# Patient Record
Sex: Male | Born: 1935 | Race: White | Hispanic: No | Marital: Married | Smoking: Never smoker
Health system: Southern US, Community
[De-identification: ages and names within clinical notes are randomized; demographics above are authoritative.]

## PROBLEM LIST (undated history)

## (undated) DIAGNOSIS — I252 Old myocardial infarction: Secondary | ICD-10-CM

## (undated) DIAGNOSIS — K219 Gastro-esophageal reflux disease without esophagitis: Secondary | ICD-10-CM

---

## 2019-02-22 ENCOUNTER — Other Ambulatory Visit: Payer: Self-pay

## 2019-02-22 ENCOUNTER — Encounter: Payer: Self-pay | Admitting: Emergency Medicine

## 2019-02-22 ENCOUNTER — Emergency Department
Admission: EM | Admit: 2019-02-22 | Discharge: 2019-02-22 | Disposition: A | Discharge status: Home / Self Care | Payer: Medicare PPO | Attending: Emergency Medicine | Admitting: Emergency Medicine

## 2019-02-22 ENCOUNTER — Emergency Department: Payer: Medicare PPO

## 2019-02-22 DIAGNOSIS — R42 Dizziness and giddiness: Secondary | ICD-10-CM

## 2019-02-22 DIAGNOSIS — I252 Old myocardial infarction: Secondary | ICD-10-CM | POA: Insufficient documentation

## 2019-02-22 DIAGNOSIS — R112 Nausea with vomiting, unspecified: Secondary | ICD-10-CM | POA: Insufficient documentation

## 2019-02-22 HISTORY — DX: Old myocardial infarction: I25.2

## 2019-02-22 HISTORY — DX: Gastro-esophageal reflux disease without esophagitis: K21.9

## 2019-02-22 LAB — CBC
HCT: 45.3 % (ref 39.0–52.0)
Hemoglobin: 15.7 g/dL (ref 13.0–17.0)
MCH: 29.6 pg (ref 26.0–34.0)
MCHC: 34.7 g/dL (ref 30.0–36.0)
MCV: 85.5 fL (ref 80.0–100.0)
Platelets: 247 10*3/uL (ref 150–400)
RBC: 5.3 MIL/uL (ref 4.22–5.81)
RDW: 14.2 % (ref 11.5–15.5)
WBC: 9.5 10*3/uL (ref 4.0–10.5)
nRBC: 0 % (ref 0.0–0.2)

## 2019-02-22 LAB — COMPREHENSIVE METABOLIC PANEL
ALT: 23 U/L (ref 0–44)
AST: 21 U/L (ref 15–41)
Albumin: 4.3 g/dL (ref 3.5–5.0)
Alkaline Phosphatase: 81 U/L (ref 38–126)
Anion gap: 14 (ref 5–15)
BUN: 16 mg/dL (ref 8–23)
CO2: 22 mmol/L (ref 22–32)
Calcium: 9.2 mg/dL (ref 8.9–10.3)
Chloride: 101 mmol/L (ref 98–111)
Creatinine, Ser: 1.02 mg/dL (ref 0.61–1.24)
GFR calc Af Amer: >60 mL/min (ref >60)
GFR calc non Af Amer: >60 mL/min (ref >60)
Glucose, Bld: 157 mg/dL — ABNORMAL HIGH (ref 70–99)
Potassium: 3.7 mmol/L (ref 3.5–5.1)
Sodium: 137 mmol/L (ref 135–145)
Total Bilirubin: 0.7 mg/dL (ref 0.3–1.2)
Total Protein: 7.8 g/dL (ref 6.5–8.1)

## 2019-02-22 LAB — URINALYSIS, COMPLETE (UACMP) WITH MICROSCOPIC
Bacteria, UA: NONE SEEN
Bilirubin Urine: NEGATIVE
Glucose, UA: NEGATIVE mg/dL
Hgb urine dipstick: NEGATIVE
Ketones, ur: 5 mg/dL — AB
Leukocytes,Ua: NEGATIVE
Nitrite: NEGATIVE
Protein, ur: 30 mg/dL — AB
Specific Gravity, Urine: 1.011 (ref 1.005–1.030)
Squamous Epithelial / HPF: NONE SEEN (ref 0–5)
pH: 7 (ref 5.0–8.0)

## 2019-02-22 LAB — TROPONIN I (HIGH SENSITIVITY): Troponin I (High Sensitivity): 5 ng/L (ref <18)

## 2019-02-22 MED ORDER — SODIUM CHLORIDE 0.9 % IV BOLUS
1000.0000 mL | Freq: Once | INTRAVENOUS | Status: AC
  Administered 2019-02-22: 1000 mL via INTRAVENOUS

## 2019-02-22 NOTE — ED Notes (Signed)
Patient transported to CT 

## 2019-02-22 NOTE — ED Notes (Signed)
Patient ambulatory to restroom, no complaints, states he feels "much better than before"

## 2019-02-22 NOTE — ED Notes (Signed)
Pt assisted to bathroom. NAD. No needs at this time. Family at bedside.

## 2019-02-22 NOTE — ED Triage Notes (Signed)
Pt got dizzy and nauseated at home.  Pt felt a little better after vomiting.  4mg  zofran by EMS. Was able to ambulate to EMS stretcher. Pt denies any pain.

## 2019-02-22 NOTE — ED Provider Notes (Signed)
[facility] Emergency Department Provider Note  Time seen: 4:40 PM  I have reviewed the triage vital signs and the nursing notes.   HISTORY  Chief Complaint Dizziness   HPI Derrick Flakes. is a 85 y.o. male with a past medical history of gastric reflux, MI, presents to the emergency department for dizziness.  According to the patient he was sitting at home on his couch when he went to get up that he felt "swimmy" felt nauseated and had an episode of vomiting.  Patient states he did not feel like he was going to pass out but states he felt like he was going to fall if he tried to stand all the way out.  Denies any spinning sensation.  Patient denies any recent illnesses fever cough or shortness of breath.  Denies any recent vomiting or diarrhea prior to the dizziness.  Denies any chest pain or abdominal pain at any point.  Patient states he feels largely normal at this time.  States he has been feeling thirsty today.  Past Medical History:  Diagnosis Date  . GERD (gastroesophageal reflux disease)   . History of MI (myocardial infarction)     There are no active problems to display for this patient.    Prior to Admission medications   Not on File    No Known Allergies  No family history on file.  Social History Social History   Tobacco Use  . Smoking status: Never Smoker  . Smokeless tobacco: Never Used  Substance Use Topics  . Alcohol use: Never    Frequency: Never  . Drug use: Never    Review of Systems Constitutional: Negative for fever.  Positive for dizziness which is since resolved. Cardiovascular: Negative for chest pain. Respiratory: Negative for shortness of breath. Gastrointestinal: Negative for abdominal pain.  One episode of vomiting after the dizziness, none since. Genitourinary: Negative for urinary compaints Musculoskeletal: Negative for musculoskeletal complaints Neurological: Negative for headache All other ROS  negative  ____________________________________________   PHYSICAL EXAM:  VITAL SIGNS: ED Triage Vitals  Enc Vitals Group     BP 02/22/19 1618 (!) 159/80     Pulse Rate 02/22/19 1618 92     Resp 02/22/19 1618 (!) 98     Temp 02/22/19 1618 97.7 F (36.5 C)     Temp Source 02/22/19 1618 Oral     SpO2 02/22/19 1618 96 %     Weight 02/22/19 1618 170 lb (77.1 kg)     Height 02/22/19 1618 5\' 10"  (1.778 m)     Head Circumference --      Peak Flow --      Pain Score 02/22/19 1617 0     Pain Loc --      Pain Edu? --      Excl. in GC? --    Constitutional: Alert and oriented. Well appearing and in no distress. Eyes: Normal exam ENT      Head: Normocephalic and atraumatic.      Mouth/Throat: Mucous membranes are moist. Cardiovascular: Normal rate, regular rhythm. Respiratory: Normal respiratory effort without tachypnea nor retractions. Breath sounds are clear  Gastrointestinal: Soft and nontender. No distention.   Musculoskeletal: Nontender with normal range of motion in all extremities. Neurologic:  Normal speech and language. No gross focal neurologic deficits  Skin:  Skin is warm, dry and intact.  Psychiatric: Mood and affect are normal.   ____________________________________________    EKG  EKG viewed and interpreted by myself shows a  sinus rhythm at 86 bpm with a first-degree AV block otherwise largely normal intervals nonspecific ST changes but no ST elevation.  ____________________________________________    RADIOLOGY  CT head shows no acute abnormality  ____________________________________________   INITIAL IMPRESSION / ASSESSMENT AND PLAN / ED COURSE  Pertinent labs & imaging results that were available during my care of the patient were reviewed by me and considered in my medical decision making (see chart for details).   Patient presents emergency department after an episode of dizziness while attempting to sit up at home from his couch.  Differential this  time would include orthostatic hypotension, dehydration, anemia, electrolyte or metabolic abnormality, less likely CVA as his symptoms have since resolved.  Patient however does state he has been feeling intermittent dizziness over the past several weeks but today was the worst.  We will check labs, CT scan of the head, IV hydrate and continue to closely monitor while awaiting labs.  Patient agreeable to plan of care.  CT scan head shows no acute abnormality.  Labs are largely within normal limits.  Vitals are reassuring.  Overall the patient appears well.  Family is here with the patient currently.  We will finish IV fluids and reassess.  Patient states he is feeling much better after fluids.  Is asking to be discharged home.  We will discharge the patient home with family.  Derrick Harlin. was evaluated in Emergency Department on 02/22/2019 for the symptoms described in the history of present illness. He was evaluated in the context of the global COVID-19 pandemic, which necessitated consideration that the patient might be at risk for infection with the SARS-CoV-2 virus that causes COVID-19. Institutional protocols and algorithms that pertain to the evaluation of patients at risk for COVID-19 are in a state of rapid change based on information released by regulatory bodies including the CDC and federal and state organizations. These policies and algorithms were followed during the patient's care in the ED.  ____________________________________________   FINAL CLINICAL IMPRESSION(S) / ED DIAGNOSES  Dizziness   Harvest Dark, MD 02/22/19 1856

## 2020-09-18 IMAGING — CT CT HEAD W/O CM
4 series · 16 of 47 positions shown, 18 images · non-contrast
Comparison: None.

CLINICAL DATA: [AGE] with vertigo. Vertigo, persistent,
central. Dizziness and nausea at home. Vomiting.

EXAM:
CT HEAD WITHOUT CONTRAST
TECHNIQUE: Contiguous axial images were obtained from the base of the skull
through the vertex without intravenous contrast.

[Series 2: head wo · axial · 0.43mm/px · z∈[+332,+442]mm · 7 of 30 slices shown, 9 images]
[im 4/30  brain]
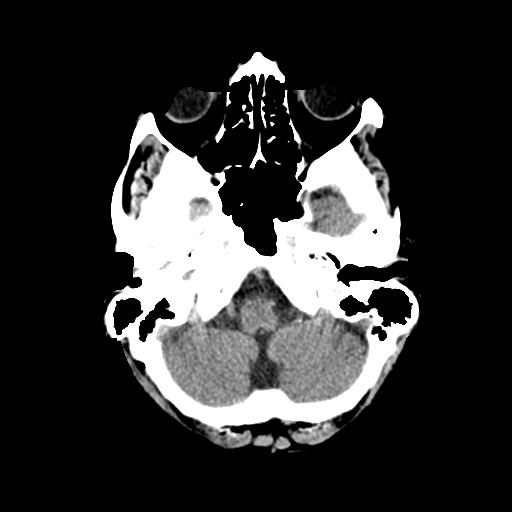
[im 4/30  bone]
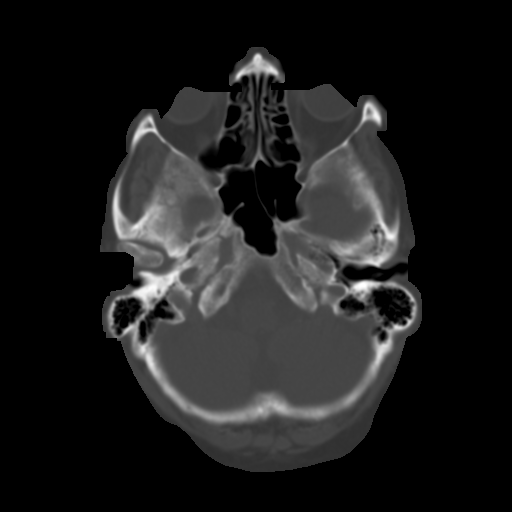
[im 8/30  brain]
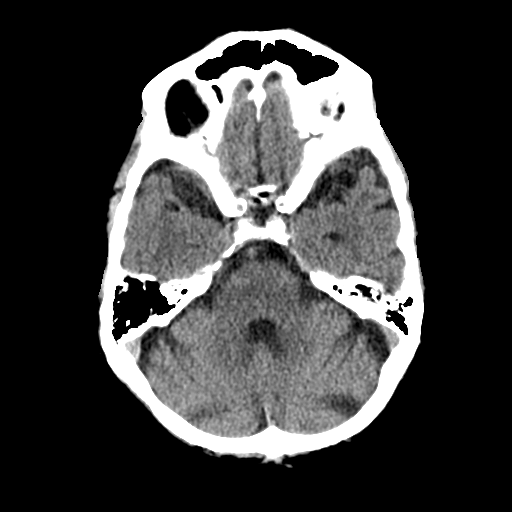
[im 11/30  brain]
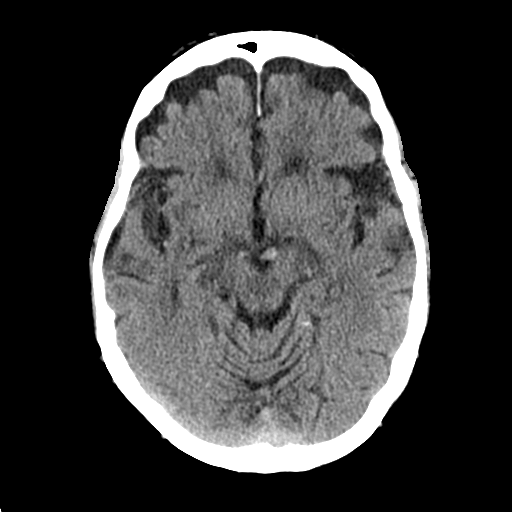
[im 15/30  brain]
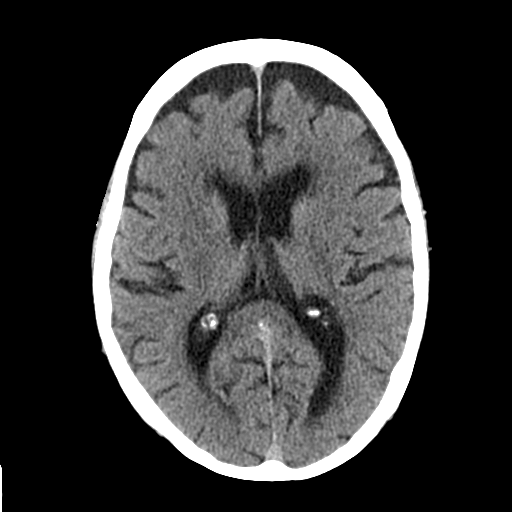
[im 19/30  brain]
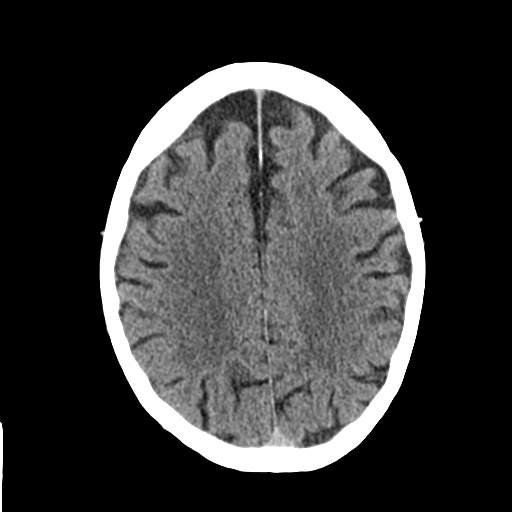
[im 19/30  bone]
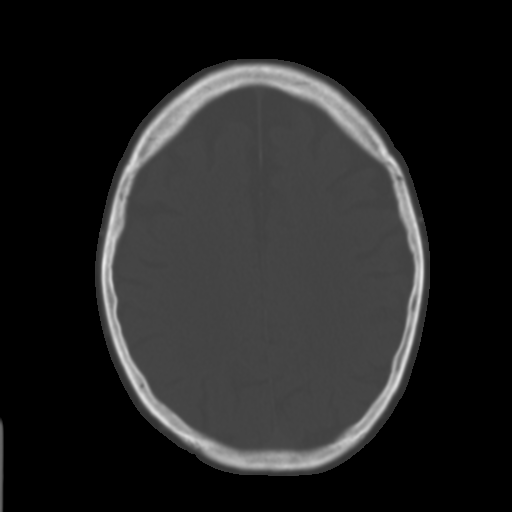
[im 22/30  brain]
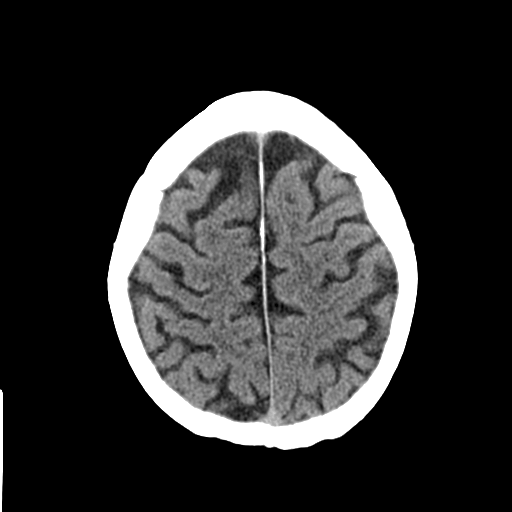
[im 26/30  brain]
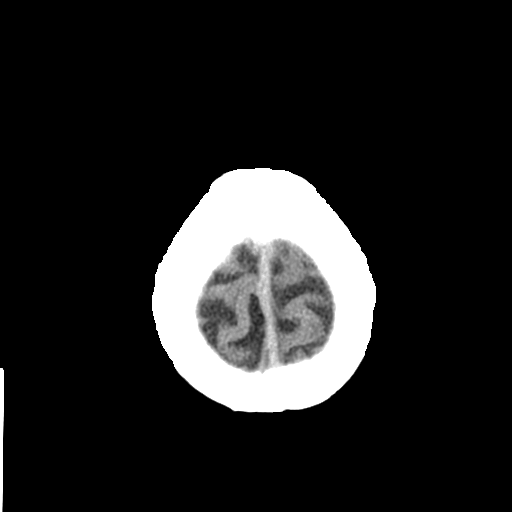

[Series 3: head bone · axial · 0.43mm/px · z∈[+331,+361]mm · 3 of 75 slices shown]
[im 8/75  bone]
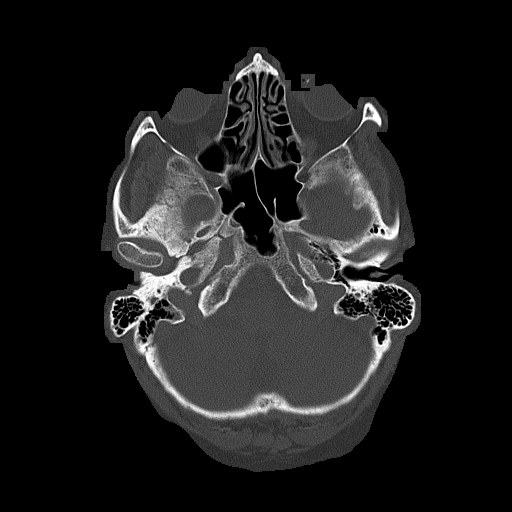
[im 15/75  bone]
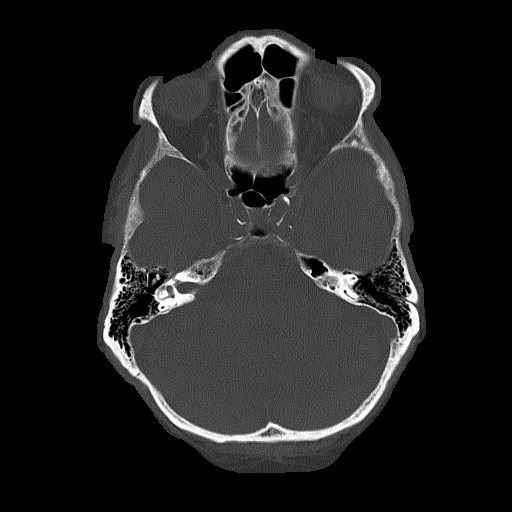
[im 23/75  bone]
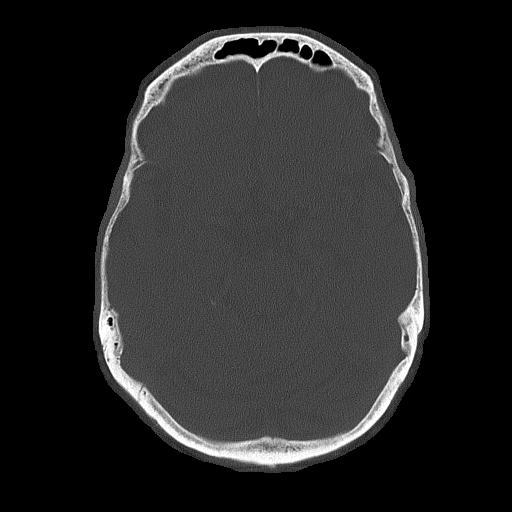

[Series 4: coronal soft tissue · coronal · 0.29mm/px · 3 of 66 slices shown]
[im 22/66  brain]
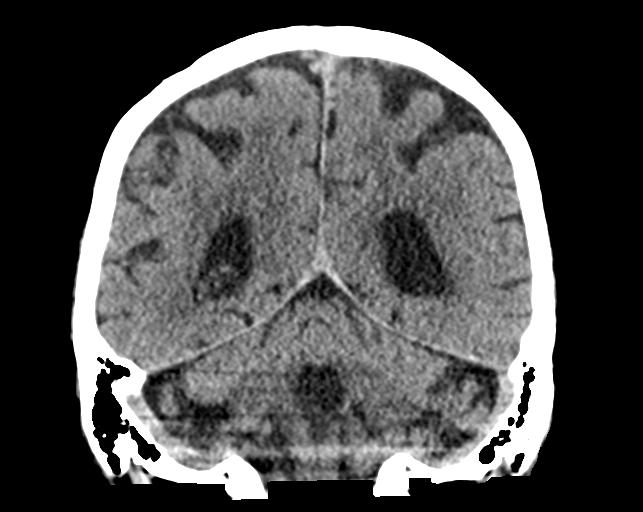
[im 29/66  brain]
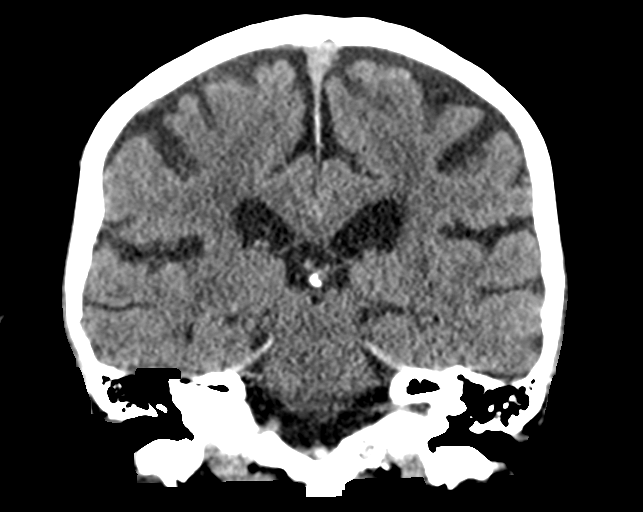
[im 37/66  brain]
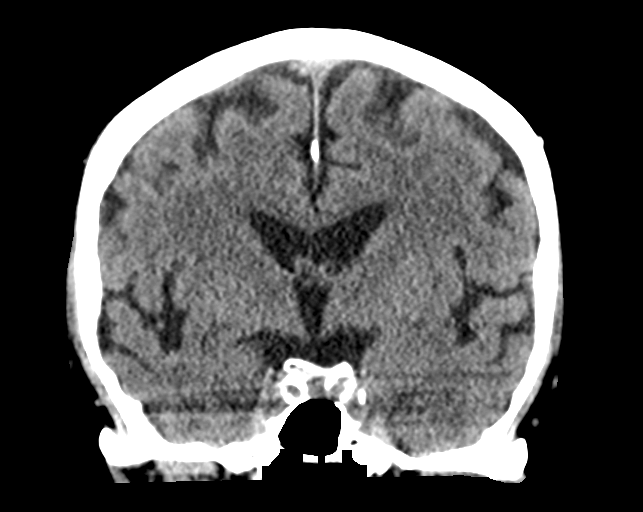

[Series 5: sagittal soft tissue · sagittal · 0.30mm/px · 3 of 51 slices shown]
[im 17/51  brain]
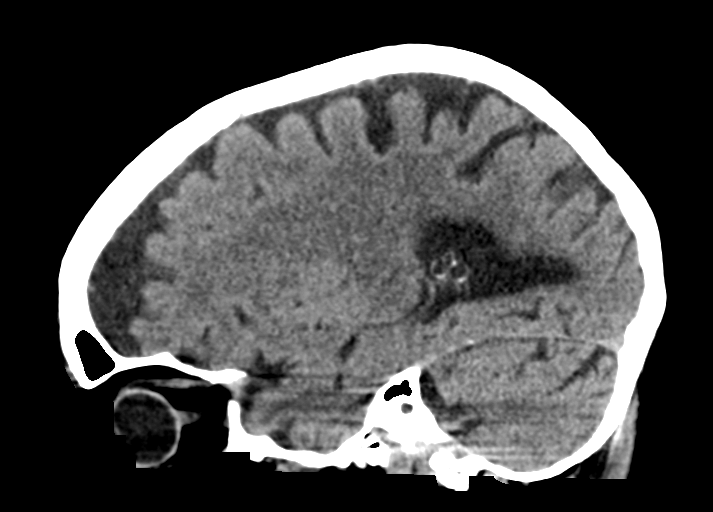
[im 26/51  brain]
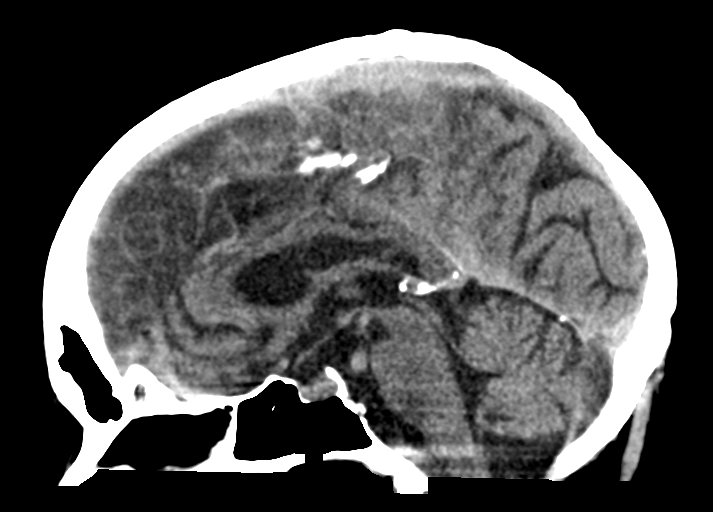
[im 34/51  brain]
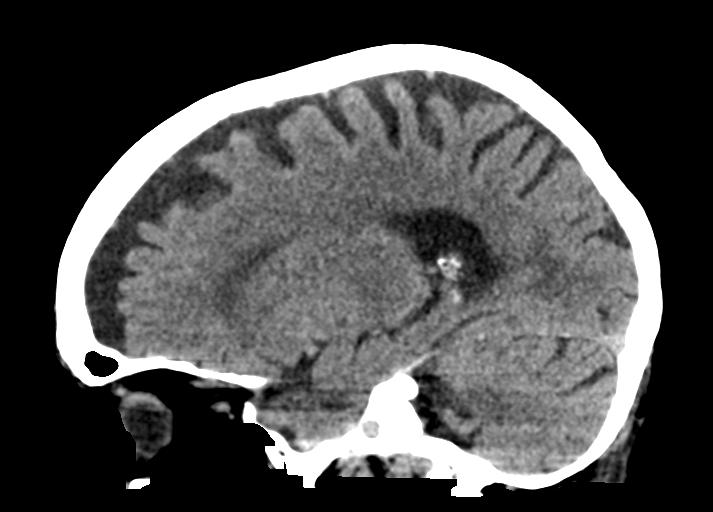

[16 of 47 positions shown; findings below may reference images not displayed]

FINDINGS: Brain: Age related atrophy. Mild for age chronic small vessel
ischemia. No intracranial hemorrhage, mass effect, or midline shift.
No hydrocephalus. The basilar cisterns are patent. No evidence of
territorial infarct or acute ischemia. Tiny remote lacunar infarct
in the right basal ganglia. No extra-axial or intracranial fluid
collection.

Vascular: Atherosclerosis of skullbase vasculature without
hyperdense vessel or abnormal calcification.

Skull: No fracture or focal lesion.

Sinuses/Orbits: Paranasal sinuses and mastoid air cells are clear.
The visualized orbits are unremarkable. Bilateral cataract
resection.

Other: None.
IMPRESSION: 1. No acute intracranial abnormality.
2. Age related atrophy and chronic small vessel ischemia.

## 2022-08-12 DEATH — deceased
